# Patient Record
Sex: Male | Born: 1995 | Race: White | Hispanic: No | Marital: Single | Smoking: Never smoker
Health system: Southern US, Community
[De-identification: ages and names within clinical notes are randomized; demographics above are authoritative.]

## PROBLEM LIST (undated history)

## (undated) DIAGNOSIS — K2210 Ulcer of esophagus without bleeding: Secondary | ICD-10-CM

## (undated) HISTORY — DX: Ulcer of esophagus without bleeding: K22.10

## (undated) HISTORY — PX: NO PAST SURGERIES: SHX2092

---

## 2016-04-13 ENCOUNTER — Encounter: Payer: Self-pay | Admitting: Physician Assistant

## 2016-04-14 ENCOUNTER — Encounter: Payer: Self-pay | Admitting: Physician Assistant

## 2017-07-18 ENCOUNTER — Ambulatory Visit: Payer: Commercial Managed Care - PPO | Admitting: Physician Assistant

## 2017-07-18 ENCOUNTER — Other Ambulatory Visit (INDEPENDENT_AMBULATORY_CARE_PROVIDER_SITE_OTHER): Payer: Commercial Managed Care - PPO

## 2017-07-18 ENCOUNTER — Encounter: Payer: Self-pay | Admitting: Physician Assistant

## 2017-07-18 ENCOUNTER — Encounter (INDEPENDENT_AMBULATORY_CARE_PROVIDER_SITE_OTHER): Payer: Self-pay

## 2017-07-18 VITALS — BP 102/60 | HR 98 | Ht 70.0 in | Wt 182.0 lb

## 2017-07-18 DIAGNOSIS — R110 Nausea: Secondary | ICD-10-CM | POA: Diagnosis not present

## 2017-07-18 DIAGNOSIS — R634 Abnormal weight loss: Secondary | ICD-10-CM

## 2017-07-18 DIAGNOSIS — K219 Gastro-esophageal reflux disease without esophagitis: Secondary | ICD-10-CM

## 2017-07-18 DIAGNOSIS — R112 Nausea with vomiting, unspecified: Secondary | ICD-10-CM | POA: Diagnosis not present

## 2017-07-18 LAB — CBC WITH DIFFERENTIAL/PLATELET
Basophils Absolute: 0.1 10*3/uL (ref 0.0–0.1)
Basophils Relative: 1.1 % (ref 0.0–3.0)
EOS PCT: 1 % (ref 0.0–5.0)
Eosinophils Absolute: 0.1 10*3/uL (ref 0.0–0.7)
HCT: 47 % (ref 39.0–52.0)
Hemoglobin: 16.3 g/dL (ref 13.0–17.0)
LYMPHS ABS: 2.2 10*3/uL (ref 0.7–4.0)
Lymphocytes Relative: 25.3 % (ref 12.0–46.0)
MCHC: 34.6 g/dL (ref 30.0–36.0)
MCV: 92.9 fl (ref 78.0–100.0)
Monocytes Absolute: 0.7 10*3/uL (ref 0.1–1.0)
Monocytes Relative: 8 % (ref 3.0–12.0)
Neutro Abs: 5.5 10*3/uL (ref 1.4–7.7)
Neutrophils Relative %: 64.6 % (ref 43.0–77.0)
Platelets: 260 10*3/uL (ref 150.0–400.0)
RBC: 5.05 Mil/uL (ref 4.22–5.81)
RDW: 12.8 % (ref 11.5–14.6)
WBC: 8.6 10*3/uL (ref 4.5–10.5)

## 2017-07-18 LAB — COMPREHENSIVE METABOLIC PANEL
ALK PHOS: 73 U/L (ref 39–117)
ALT: 20 U/L (ref 0–53)
AST: 17 U/L (ref 0–37)
Albumin: 4.8 g/dL (ref 3.5–5.2)
BILIRUBIN TOTAL: 1.3 mg/dL — AB (ref 0.2–1.2)
BUN: 13 mg/dL (ref 6–23)
CO2: 30 mEq/L (ref 19–32)
Calcium: 10.1 mg/dL (ref 8.4–10.5)
Chloride: 101 mEq/L (ref 96–112)
Creatinine, Ser: 1.02 mg/dL (ref 0.40–1.50)
GFR: 98.16 mL/min (ref >60.00)
GLUCOSE: 101 mg/dL — AB (ref 70–99)
POTASSIUM: 4.1 meq/L (ref 3.5–5.1)
SODIUM: 140 meq/L (ref 135–145)
TOTAL PROTEIN: 7.8 g/dL (ref 6.0–8.3)

## 2017-07-18 LAB — SEDIMENTATION RATE: SED RATE: 6 mm/h (ref 0–15)

## 2017-07-18 LAB — HIGH SENSITIVITY CRP: CRP, High Sensitivity: 8.49 mg/L — ABNORMAL HIGH (ref 0.000–5.000)

## 2017-07-18 MED ORDER — ONDANSETRON HCL 4 MG PO TABS: ORAL_TABLET | ORAL | 1 refills | Status: DC

## 2017-07-18 MED ORDER — PANTOPRAZOLE SODIUM 40 MG PO TBEC: DELAYED_RELEASE_TABLET | ORAL | 6 refills | Status: DC

## 2017-07-18 NOTE — Progress Notes (Addendum)
Subjective:    Patient ID: Luis Smith, male    DOB: [birth year], 22 y.o.   MRN: [id number]  HPI Luis Smith is a pleasant 22 year old white male, new to GI today, self-referred.  He is accompanied by his mother.  Patient is a Consulting civil engineer at [employer]. He relates that he has had prior GI evaluation done a little over a year ago in [facility]. [facility].  He apparently had an upper endoscopy which was unremarkable, gastric emptying scan which was normal and an ultrasound which showed sludge in the gallbladder. He remembers being given Prilosec short-term which did help some of his heartburn symptoms, however he was not continued on medication. Says over the past couple of years he has had ongoing problems with heartburn and indigestion particularly with tomato sauce and red sauces.  He says if he eats pizza or something similar he will be miserable. He currently is having very frequent nausea and queasiness.  He wakes up early in the morning feeling nauseated is generally unable to eat any breakfast.  When he does eat he says he feels full and uncomfortable very quickly and bloated after eating.  Continues to have heartburn off and on.  His weight is down about 18 pounds over the past 2 years gradually.  He says he is usually only eating about 1 meal per day because he feels so uncomfortable after eating.  His bowel movements have been normal, no problems with constipation diarrhea melena or hematochezia. Is not on any regular medications. Family history negative for colon cancer or polyps IBD and celiac disease.  Review of Systems Pertinent positive and negative review of systems were noted in the above HPI section.  All other review of systems was otherwise negative.  Outpatient Encounter Medications as of 07/18/2017  Medication Sig  . ondansetron (ZOFRAN) 4 MG tablet Take 1 tab by mouth every 6-8 hours as needed for nausea.  . pantoprazole (PROTONIX)  40 MG tablet Take 1 tab by mouth before dinner daily.   No facility-administered encounter medications on file as of 07/18/2017.    No Known Allergies There are no active problems to display for this patient.  Social History   Socioeconomic History  . Marital status: Single    Spouse name: Not on file  . Number of children: Not on file  . Years of education: Not on file  . Highest education level: Not on file  Occupational History  . Not on file  Social Needs  . Financial resource strain: Not on file  . Food insecurity:    Worry: Not on file    Inability: Not on file  . Transportation needs:    Medical: Not on file    Non-medical: Not on file  Tobacco Use  . Smoking status: Never Smoker  . Smokeless tobacco: Never Used  Substance and Sexual Activity  . Alcohol use: Yes    Comment: RARE  . Drug use: Never  . Sexual activity: Not on file  Lifestyle  . Physical activity:    Days per week: Not on file    Minutes per session: Not on file  . Stress: Not on file  Relationships  . Social connections:    Talks on phone: Not on file    Gets together: Not on file    Attends religious service: Not on file    Active member of club or organization: Not on file    Attends meetings of clubs or organizations:  Not on file    Relationship status: Not on file  . Intimate partner violence:    Fear of current or ex partner: Not on file    Emotionally abused: Not on file    Physically abused: Not on file    Forced sexual activity: Not on file  Other Topics Concern  . Not on file  Social History Narrative  . Not on file    Luis Smith's family history includes Diabetes in his father, paternal grandfather, and paternal grandmother; Heart attack in his maternal grandfather.      Objective:    Vitals:   07/18/17 0918  BP: 102/60  Pulse: 98    Physical Exam well-developed young white male in no acute distress, accompanied by his mother both pleasant blood pressure 102/60 pulse  98, height 5 foot 10, weight 182, BMI 26.1.  HEENT; nontraumatic normocephalic EOMI PERRLA sclera anicteric, Cardiovascular; regular rate and rhythm with S1-S2 no murmur rub or gallop, Pulmonary ;clear bilaterally, Abdomen ;soft, basically nontender there is no palpable mass or hepatosplenomegaly bowel sounds are present, Rectal ;exam not done, Extremities ;no clubbing cyanosis or edema skin warm dry, Neuro psych; mood and affect appropriate       Assessment & Plan:   #261 22 year old white male, college student with 2-year history of heartburn/GERD, and now several month history of progressive frequent nausea and queasiness especially early in the a.m. and postprandially.  In addition has fullness and bloating in the epigastrium postprandially.  He has had an associated 18 pound weight loss over the past 2 years  Previous GI evaluation was unremarkable with the exception of gallbladder sludge.  Rule out symptoms all secondary to GERD, rule out biliary dyskinesia, non-ulcer dyspepsia  Plan; start Protonix 40 mg p.o. before meals dinner Antireflux regimen Zofran 4 mg every 6 hours as needed for nausea.  He is encouraged to try taking 1 in the morning to see if this last him for most of the day. Scheduled for CCK HIDA scan Patient has signed a release and we will obtain his records [facility]. I will plan to see him back in the office in 3-4 weeks.  Patient will be established with Dr. Christella HartiganJacobs.   Addendum; 08/01/17-[facility].  EGD 04/05/2016 question Barrett's esophagus distal third 5 mm GE junction submucosal nodule and 1 cm hiatal hernia.  Biopsies of the GE junction showed regenerative changes consistent with a healing erosion, esophageal biopsies negative for Barrett's gastric emptying scan 4-hour study normal December 2017 upper abdominal ultrasound December 2017 showed gallbladder sludge  no evidence of acute cholecystitis CBD of 3 mm.  Records sent to be scanned.  Amy S Esterwood PA-C 07/18/2017   Cc: No ref. provider found

## 2017-07-18 NOTE — Progress Notes (Signed)
I agree with the above note, plan 

## 2017-07-18 NOTE — Patient Instructions (Addendum)
Please go to the basement level to have your labs drawn.  We have sent the following medications to your pharmacy for you to pick up at your convenience: [facility] [address] [place]. 1. Zofran 4 mg 2. Pantoprazole sodium 40 mg  You have been scheduled for a HIDA scan at [facility] (1st floor) on Tuesday 07-25-17. Please arrive at 7:15 amto your scheduled appointment at  7:30 am Make certain not to have anything to eat or drink after midnight  Should this appointment date or time not work well for you, please call radiology scheduling at [phone].   IIf you are age 21 or younger, your body mass index should be between 19-25. Your Body mass index is 26.11 kg/m. If this is out of the aformentioned range listed, please consider follow up with your Primary Care Provider.   _____________________________________________________________________ hepatobiliary (HIDA) scan is an imaging procedure used to diagnose problems in the liver, gallbladder and bile ducts. In the HIDA scan, a radioactive chemical or tracer is injected into a vein in your arm. The tracer is handled by the liver like bile. Bile is a fluid produced and excreted by your liver that helps your digestive system break down fats in the foods you eat. Bile is stored in your gallbladder and the gallbladder releases the bile when you eat a meal. A special nuclear medicine scanner (gamma camera) tracks the flow of the tracer from your liver into your gallbladder and small intestine.  During your HIDA scan  You'll be asked to change into a hospital gown before your HIDA scan begins. Your health care team will position you on a table, usually on your back. The radioactive tracer is then injected into a vein in your arm.The tracer travels through your bloodstream to your liver, where it's taken up by the bile-producing cells. The radioactive tracer travels with the bile from your liver into your gallbladder and through your bile ducts to  your small intestine.You may feel some pressure while the radioactive tracer is injected into your vein. As you lie on the table, a special gamma camera is positioned over your abdomen taking pictures of the tracer as it moves through your body. The gamma camera takes pictures continually for about an hour. You'll need to keep still during the HIDA scan. This can become uncomfortable, but you may find that you can lessen the discomfort by taking deep breaths and thinking about other things. Tell your health care team if you're uncomfortable. The radiologist will watch on a computer the progress of the radioactive tracer through your body. The HIDA scan may be stopped when the radioactive tracer is seen in the gallbladder and enters your small intestine. This typically takes about an hour. In some cases extra imaging will be performed if original images aren't satisfactory, if morphine is given to help visualize the gallbladder or if the medication CCK is given to look at the contraction of the gallbladder. This test typically takes 2 hours to complete. ________________________________________________________________________

## 2017-07-25 ENCOUNTER — Ambulatory Visit (HOSPITAL_COMMUNITY)
Admission: RE | Admit: 2017-07-25 | Discharge: 2017-07-25 | Disposition: A | Discharge status: Home / Self Care | Payer: Commercial Managed Care - PPO | Source: Ambulatory Visit | Attending: Physician Assistant | Admitting: Physician Assistant

## 2017-07-25 DIAGNOSIS — R110 Nausea: Secondary | ICD-10-CM

## 2017-07-25 DIAGNOSIS — R634 Abnormal weight loss: Secondary | ICD-10-CM | POA: Insufficient documentation

## 2017-07-25 DIAGNOSIS — R112 Nausea with vomiting, unspecified: Secondary | ICD-10-CM | POA: Insufficient documentation

## 2017-07-25 DIAGNOSIS — K219 Gastro-esophageal reflux disease without esophagitis: Secondary | ICD-10-CM | POA: Diagnosis present

## 2017-07-25 MED ORDER — TECHNETIUM TC 99M MEBROFENIN IV KIT
5.0300 | PACK | Freq: Once | INTRAVENOUS | Status: AC | PRN
  Administered 2017-07-25: 5.03 via INTRAVENOUS

## 2017-07-28 ENCOUNTER — Telehealth: Payer: Self-pay | Admitting: Physician Assistant

## 2017-07-28 NOTE — Telephone Encounter (Signed)
Advised. Please see the imaging report.

## 2017-07-28 NOTE — Telephone Encounter (Signed)
Patient wanting to know when results of hida scan from 4.9.19 will be back. Requesting a call to discuss.

## 2017-09-26 ENCOUNTER — Other Ambulatory Visit (INDEPENDENT_AMBULATORY_CARE_PROVIDER_SITE_OTHER): Payer: Commercial Managed Care - PPO

## 2017-09-26 ENCOUNTER — Encounter: Payer: Self-pay | Admitting: Physician Assistant

## 2017-09-26 ENCOUNTER — Ambulatory Visit (INDEPENDENT_AMBULATORY_CARE_PROVIDER_SITE_OTHER): Payer: Commercial Managed Care - PPO | Admitting: Physician Assistant

## 2017-09-26 ENCOUNTER — Encounter (INDEPENDENT_AMBULATORY_CARE_PROVIDER_SITE_OTHER): Payer: Self-pay

## 2017-09-26 VITALS — BP 102/58 | HR 80 | Ht 70.0 in | Wt 186.0 lb

## 2017-09-26 DIAGNOSIS — R1013 Epigastric pain: Secondary | ICD-10-CM | POA: Diagnosis not present

## 2017-09-26 DIAGNOSIS — R6881 Early satiety: Secondary | ICD-10-CM

## 2017-09-26 DIAGNOSIS — R110 Nausea: Secondary | ICD-10-CM

## 2017-09-26 DIAGNOSIS — K219 Gastro-esophageal reflux disease without esophagitis: Secondary | ICD-10-CM

## 2017-09-26 LAB — IGA: IgA: 148 mg/dL (ref 68–378)

## 2017-09-26 MED ORDER — PANTOPRAZOLE SODIUM 40 MG PO TBEC: DELAYED_RELEASE_TABLET | ORAL | 6 refills | Status: DC

## 2017-09-26 NOTE — Patient Instructions (Signed)
Your provider has requested that you go to the basement level for lab work before leaving today. Press "B" on the elevator. The lab is located at the first door on the left as you exit the elevator. We sent a prescription to your pharmacy for Pantoprazole sodium 40 mg.  .  You have been scheduled for an endoscopy. Please follow written instructions given to you at your visit today. If you use inhalers (even only as needed), please bring them with you on the day of your procedure.

## 2017-09-26 NOTE — Progress Notes (Signed)
Subjective:    Patient ID: Luis Smith, male    DOB: 03/02/1996, 22 y.o.   MRN: [id number]  HPI Luis Smith is a pleasant 22 year old white male, who was seen in our office on 07/18/2017 by myself and established with Dr. Christella HartiganJacobs.  He comes back in today for follow-up.   Patient was seen with complaints of heartburn and indigestion and a several month history of frequent nausea and queasiness especially prevalent early in the mornings and then postprandially.  In association with that he had sense of abdominal fullness and bloating and some early satiety.  He related an 18 pound weight loss over the past 2 years. [place] had [place] with EGD which was negative with exception of Barrett's esophagus.  Biopsies were taken and were negative.  4-hour gastric emptying scan was done and normal and abdominal ultrasound showed gallbladder sludge but no stones in the CBD of 3 mm. Patient was placed on Protonix 40 mg p.o. every morning at the time of his last office visit and was given Zofran to use on a as needed basis.  We scheduled him for CCK HIDA scan which showed a normal ejection fraction. Labs were also done CRP was mildly elevated at 8.4, sed rate was 6 labs otherwise unremarkable. His weight is stable actually up about 4 pounds since his last visit. He says he feels about the same but on further questioning has not been having any difficulty with bloating or gas recently.  He does not feel that his symptoms are related to anxiety, other than some anxiety about feeling nauseated.  He says he can tell when his nausea is going to start and usually he stops eating for a few hours and then it subsides.  He will still have symptoms of feeling as if his food is sitting in his esophagus, which gives him a very full uncomfortable feeling.  He has no complaints of dysphasia or odynophagia and is not having any heartburn or indigestion at this point. 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   ROS; Pertinent positive and negative review of systems were noted in the above HPI section.  All other review of systems was otherwise negative.  Outpatient Encounter Medications as of 09/26/2017  Medication Sig  . ondansetron (ZOFRAN) 4 MG tablet Take 1 tab by mouth every 6-8 hours as needed for nausea.  . pantoprazole (PROTONIX) 40 MG tablet Take 1 tab by mouth  Every morning.  . [DISCONTINUED] pantoprazole (PROTONIX) 40 MG tablet Take 1 tab by mouth before dinner daily.   No facility-administered encounter medications on file as of 09/26/2017.    No Known Allergies There are no active problems to display for this patient.  Social History   Socioeconomic History  . Marital status: Single    Spouse name: Not on file  . Number of children: Not on file  . Years of education: Not on file  . Highest education level: Not on file  Occupational History  . Not on file  Social Needs  . Financial resource strain: Not on file  . Food insecurity:    Worry: Not on file    Inability: Not on file  . Transportation needs:    Medical: Not on file    Non-medical: Not on file  Tobacco Use  . Smoking status: Never Smoker  . Smokeless tobacco: Never Used  Substance and Sexual Activity  .  Alcohol use: Yes    Comment: RARE  . Drug use: Never  . Sexual activity: Not on file  Lifestyle  . Physical activity:    Days per week: Not on file    Minutes per session: Not on file  . Stress: Not on file  Relationships  . Social connections:    Talks on phone: Not on file    Gets together: Not on file    Attends religious service: Not on file    Active member of club or organization: Not on file    Attends meetings of clubs or organizations: Not on file    Relationship status: Not on file  . Intimate partner violence:    Fear of current or ex partner: Not on file    Emotionally abused: Not on file    Physically  abused: Not on file    Forced sexual activity: Not on file  Other Topics Concern  . Not on file  Social History Narrative  . Not on file    Mr. Markgraf family history includes Diabetes in his father, paternal grandfather, and paternal grandmother; Heart attack in his maternal grandfather.      Objective:    Vitals:   09/26/17 0824  BP: (!) 102/58  Pulse: 80    Physical Exam; well-developed young white male, no acute distress, pleasant accompanied by his mother blood pressure 102/58, pulse 80, height 5 foot 10, weight 186, BMI 26.6.  Not further examined today       Assessment & Plan:   #6 22 year old white male with several month history of frequent nausea queasiness generally most noticeable early in the morning and then postprandially very This had been associated with heartburn and indigestion which has improved on PPI therapy.  He is also had associated fullness, some early satiety and vague dysphasia. Previous GI work-up in 2017 done in [facility] with negative EGD other than question of Barrett's however biopsies were negative, abdominal ultrasound showed gallbladder sludge and gastric emptying scan was normal. Recent CCK HIDA scan normal.  Etiology of his symptoms is not entirely clear, I think he does have a component of GERD which is improved with PPI therapy.  Rule out chronic gastropathy, functional dyspepsia, celiac disease.  Plan; continue Protonix 40 mg p.o. every morning Patient will be scheduled for upper endoscopy with biopsies, with Dr. Christella Hartigan.  Procedure was discussed in detail with the patient including indications risks and benefits and he is agreeable to proceed. Check TTG and IgA If EGD is unremarkable, consider CT imaging, and also would consider trial of BuSpar for possible functional dyspepsia.  Lilymarie Scroggins S Larenzo Caples PA-C 09/26/2017   Cc: No ref. provider found

## 2017-09-27 NOTE — Progress Notes (Signed)
I agree with the above note, plan 

## 2017-09-28 ENCOUNTER — Encounter: Payer: Self-pay | Admitting: Gastroenterology

## 2017-09-28 LAB — TISSUE TRANSGLUTAMINASE, IGG: (TTG) AB, IGG: 1 U/mL

## 2017-10-10 ENCOUNTER — Encounter: Payer: Self-pay | Admitting: Gastroenterology

## 2017-10-10 ENCOUNTER — Ambulatory Visit (AMBULATORY_SURGERY_CENTER): Payer: Commercial Managed Care - PPO | Admitting: Gastroenterology

## 2017-10-10 ENCOUNTER — Other Ambulatory Visit: Payer: Self-pay

## 2017-10-10 VITALS — BP 116/60 | HR 70 | Temp 98.6°F | Resp 10 | Ht 70.0 in | Wt 186.0 lb

## 2017-10-10 DIAGNOSIS — K209 Esophagitis, unspecified without bleeding: Secondary | ICD-10-CM

## 2017-10-10 DIAGNOSIS — K2990 Gastroduodenitis, unspecified, without bleeding: Secondary | ICD-10-CM

## 2017-10-10 DIAGNOSIS — K2970 Gastritis, unspecified, without bleeding: Secondary | ICD-10-CM

## 2017-10-10 DIAGNOSIS — R112 Nausea with vomiting, unspecified: Secondary | ICD-10-CM

## 2017-10-10 DIAGNOSIS — R110 Nausea: Secondary | ICD-10-CM

## 2017-10-10 DIAGNOSIS — K2950 Unspecified chronic gastritis without bleeding: Secondary | ICD-10-CM | POA: Diagnosis not present

## 2017-10-10 MED ORDER — SODIUM CHLORIDE 0.9 % IV SOLN: 500.0000 mL | Freq: Once | INTRAVENOUS | Status: DC

## 2017-10-10 MED ORDER — PANTOPRAZOLE SODIUM 40 MG PO TBEC: DELAYED_RELEASE_TABLET | ORAL | 11 refills | Status: DC

## 2017-10-10 NOTE — Patient Instructions (Signed)
**   Handouts given on gastritis and esophagitis ** Start taking Pantoprazole twice daily as directed. Also take Ranitidine 150mg  (OTC) at bedtime. **   YOU HAD AN ENDOSCOPIC PROCEDURE TODAY AT THE [facility]:   Refer to the procedure report that was given to you for any specific questions about what was found during the examination.  If the procedure report does not answer your questions, please call your gastroenterologist to clarify.  If you requested that your care partner not be given the details of your procedure findings, then the procedure report has been included in a sealed envelope for you to review at your convenience later.  YOU SHOULD EXPECT: Some feelings of bloating in the abdomen. Passage of more gas than usual.  Walking can help get rid of the air that was put into your GI tract during the procedure and reduce the bloating. If you had a lower endoscopy (such as a colonoscopy or flexible sigmoidoscopy) you may notice spotting of blood in your stool or on the toilet paper. If you underwent a bowel prep for your procedure, you may not have a normal bowel movement for a few days.  Please Note:  You might notice some irritation and congestion in your nose or some drainage.  This is from the oxygen used during your procedure.  There is no need for concern and it should clear up in a day or so.  SYMPTOMS TO REPORT IMMEDIATELY:   Following upper endoscopy (EGD)  Vomiting of blood or coffee ground material  New chest pain or pain under the shoulder blades  Painful or persistently difficult swallowing  New shortness of breath  Fever of 100F or higher  Black, tarry-looking stools  For urgent or emergent issues, a gastroenterologist can be reached at any hour by calling [phone].   DIET:  We do recommend a small meal at first, but then you may proceed to your regular diet.  Drink plenty of fluids but you should avoid alcoholic beverages for 24 hours.  ACTIVITY:  You  should plan to take it easy for the rest of today and you should NOT DRIVE or use heavy machinery until tomorrow (because of the sedation medicines used during the test).    FOLLOW UP: Our staff will call the number listed on your records the next business day following your procedure to check on you and address any questions or concerns that you may have regarding the information given to you following your procedure. If we do not reach you, we will leave a message.  However, if you are feeling well and you are not experiencing any problems, there is no need to return our call.  We will assume that you have returned to your regular daily activities without incident.  If any biopsies were taken you will be contacted by phone or by letter within the next 1-3 weeks.  Please call us at [phone] if you have not heard about the biopsies in 3 weeks.    SIGNATURES/CONFIDENTIALITY: You and/or your care partner have signed paperwork which will be entered into your electronic medical record.  These signatures attest to the fact that that the information above on your After Visit Summary has been reviewed and is understood.  Full responsibility of the confidentiality of this discharge information lies with you and/or your care-partner.

## 2017-10-10 NOTE — Op Note (Signed)
[facility] Patient Name: Luis MaesWalter Smith Procedure Date: 10/10/2017 8:00 AM MRN: [id number] Endoscopist: Rachael Feeaniel P Meliya Mcconahy , MD Age: [age] Referring MD:  Date of Birth: 08/30/1995 Gender: Male Account #: [id number] Procedure:                Upper GI endoscopy Indications:              Early satiety, Nausea with vomiting Medicines:                Monitored Anesthesia Care Procedure:                Pre-Anesthesia Assessment:                           - Prior to the procedure, a History and Physical                            was performed, and patient medications and                            allergies were reviewed. The patient's tolerance of                            previous anesthesia was also reviewed. The risks                            and benefits of the procedure and the sedation                            options and risks were discussed with the patient.                            All questions were answered, and informed consent                            was obtained. Prior Anticoagulants: The patient has                            taken no previous anticoagulant or antiplatelet                            agents. ASA Grade Assessment: II - A patient with                            mild systemic disease. After reviewing the risks                            and benefits, the patient was deemed in                            satisfactory condition to undergo the procedure.                           After obtaining informed consent, the endoscope was  passed under direct vision. Throughout the                            procedure, the patient's blood pressure, pulse, and                            oxygen saturations were monitored continuously. The                            Endoscope was introduced through the mouth, and                            advanced to the second part of duodenum. The upper                            GI endoscopy was  accomplished without difficulty.                            The patient tolerated the procedure well. 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                           Mild, non-specific inflammation characterized by                            erythema was found in the gastric antrum. Biopsies                            were taken with a cold forceps for histology (jar                            1).                           Focal area of more erythematous stomach mucosa                            (about 4cm across, located in cardia/proximal body                            along the greater curvature). This is not overtly                            neoplastic, biopsied taken (jar 2).                           The exam was otherwise without abnormality. Complications:            No immediate complications. Estimated blood loss:  None. Estimated Blood Loss:     Estimated blood loss: none. Impression:               - Gastritis biopsied in two jars.                           - Severe, acid related ulcerative esophagitis.                           - The examination was otherwise normal. Recommendation:           - Patient has a contact number available for                            emergencies. The signs and symptoms of potential                            delayed complications were discussed with the                            patient. Return to normal activities tomorrow.                            Written discharge instructions were provided to the                            patient.                           - Resume previous diet.                           - Need to increase your antiacid medicines: New                            prescription today: protonix 40mg  pill one pill                            twice daily (20-30 min before breakfast and dinner                             meals). Also please start ranitidine (OTC) 150mg                             pill, one pill at bedtime nightly.                           - Await pathology results. Rachael Fee, MD 10/10/2017 8:15:16 AM This report has been signed electronically.

## 2017-10-10 NOTE — Progress Notes (Signed)
Pt's states no medical or surgical changes since previsit or office visit. 

## 2017-10-10 NOTE — Progress Notes (Signed)
Called to room to assist during endoscopic procedure.  Patient ID and intended procedure confirmed with present staff. Received instructions for my participation in the procedure from the performing physician.  

## 2017-10-10 NOTE — Progress Notes (Signed)
A and O x3. Report to RN. Tolerated MAC anesthesia well.Teeth unchanged after procedure.

## 2017-10-11 ENCOUNTER — Telehealth: Payer: Self-pay

## 2017-10-11 NOTE — Telephone Encounter (Signed)
  Follow up Call-  Call back number 10/10/2017  Post procedure Call Back phone  # [phone]  Permission to leave phone message Yes     Patient questions:  Do you have a fever, pain , or abdominal swelling? No. Pain Score  0 *  Have you tolerated food without any problems? Yes.    Have you been able to return to your normal activities? Yes.    Do you have any questions about your discharge instructions: Diet   No. Medications  No. Follow up visit  No.  Do you have questions or concerns about your Care? No.  Actions: * If pain score is 4 or above: No action needed, pain <4.

## 2017-10-23 ENCOUNTER — Telehealth: Payer: Self-pay | Admitting: *Deleted

## 2017-10-23 NOTE — Telephone Encounter (Signed)
[facility] [place] Stamped and sent to be scnanned on 10-23-2017.

## 2017-12-07 ENCOUNTER — Ambulatory Visit: Payer: Commercial Managed Care - PPO | Admitting: Physician Assistant

## 2018-01-08 ENCOUNTER — Encounter (INDEPENDENT_AMBULATORY_CARE_PROVIDER_SITE_OTHER): Payer: Self-pay

## 2018-01-08 ENCOUNTER — Ambulatory Visit (INDEPENDENT_AMBULATORY_CARE_PROVIDER_SITE_OTHER): Payer: Commercial Managed Care - PPO | Admitting: Physician Assistant

## 2018-01-08 ENCOUNTER — Encounter: Payer: Self-pay | Admitting: Physician Assistant

## 2018-01-08 VITALS — BP 100/64 | HR 68 | Ht 70.0 in | Wt 192.2 lb

## 2018-01-08 DIAGNOSIS — K2100 Gastro-esophageal reflux disease with esophagitis, without bleeding: Secondary | ICD-10-CM

## 2018-01-08 DIAGNOSIS — K209 Esophagitis, unspecified without bleeding: Secondary | ICD-10-CM

## 2018-01-08 MED ORDER — PANTOPRAZOLE SODIUM 40 MG PO TBEC: DELAYED_RELEASE_TABLET | ORAL | 11 refills | Status: AC

## 2018-01-08 NOTE — Patient Instructions (Addendum)
We sent refills for Pantoprazole sodium 40 mg to [facility] [address]  [place]. Continue over the counter Zantac 150 mg at bedtime. Continue Antireflux regimine.  Elevated the head of your bed 45 degrees.  Have nothing to eat or drink for 2-3 hours before bed.   Normal BMI (Body Mass Index- based on height and weight) is between 19 and 25. Your BMI today is Body mass index is 27.58 kg/m. Marland Kitchen. Please consider follow up  regarding your BMI with your Primary Care Provider.

## 2018-01-08 NOTE — Progress Notes (Signed)
I agree with the above note, plan 

## 2018-01-08 NOTE — Progress Notes (Signed)
Subjective:    Patient ID: Luis Smith, male    DOB: 11/25/1995, 22 y.o.   MRN: [id number]  HPI Luis Smith is a pleasant 22 year old white male, known to Dr. Christella HartiganJacobs and myself.  Patient was initially seen in the office in June 2019 with complaints of dyspepsia heartburn indigestion and queasiness which seems to be worse early in the mornings and then postprandially.  He had been having symptoms for a couple of years worse over the previous year.  He related an 18 pound weight loss.  He underwent EGD with Dr. 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  Biopsies were taken from the stomach showing mild chronic gastritis no H. pylori.  At that time PPI was increased to twice daily 40 mg Protonix and in addition added to 150 mg of Zantac at bedtime.   He comes back in today for follow-up.  He says the medications helped a lot and his symptoms are much improved though not completely resolved.  He took the Protonix for about 2 months and then ran out and has not had a refill on the prescription over the past few weeks.  He is continued on Zantac 150 mg at bedtime.  He has been following an antireflux regimen, trying to go to bed with an empty stomach and sleeping with 2-3 pillows to elevate head of bed.  He denies any problems with nausea or queasiness and no abdominal discomfort today.  No dysphagia or odynophagia.  Review of Systems Pertinent positive and negative review of systems were noted in the above HPI section.  All other review of systems was otherwise negative.  Outpatient Encounter Medications as of 01/08/2018  Medication Sig  . Pseudoephedrine HCl (SUDAFED PO) Take by mouth as directed.  . pantoprazole (PROTONIX) 40 MG tablet Take one tablet by mouth twice daily. (20-30 minutes before breakfast and dinner meals)  . [DISCONTINUED] ondansetron (ZOFRAN) 4 MG tablet Take 1 tab by mouth every 6-8 hours as needed  for nausea.  . [DISCONTINUED] pantoprazole (PROTONIX) 40 MG tablet Take one tablet by mouth twice daily. (20-30 minutes before breakfast and dinner meals) (Patient not taking: Reported on 01/08/2018)  . [DISCONTINUED] 0.9 %  sodium chloride infusion    No facility-administered encounter medications on file as of 01/08/2018.    No Known Allergies There are no active problems to display for this patient.  Social History   Socioeconomic History  . Marital status: Single    Spouse name: Not on file  . Number of children: Not on file  . Years of education: Not on file  . Highest education level: Not on file  Occupational History  . Not on file  Social Needs  . Financial resource strain: Not on file  . Food insecurity:    Worry: Not on file    Inability: Not on file  . Transportation needs:    Medical: Not on file    Non-medical: Not on file  Tobacco Use  . Smoking status: Never Smoker  . Smokeless tobacco: Never Used  Substance and Sexual Activity  . Alcohol use: Yes    Comment: RARE  . Drug use: Never  . Sexual activity: Not on file  Lifestyle  . Physical activity:    Days per week: Not on file    Minutes per session: Not on file  . Stress: Not on file  Relationships  . Social connections:  Talks on phone: Not on file    Gets together: Not on file    Attends religious service: Not on file    Active member of club or organization: Not on file    Attends meetings of clubs or organizations: Not on file    Relationship status: Not on file  . Intimate partner violence:    Fear of current or ex partner: Not on file    Emotionally abused: Not on file    Physically abused: Not on file    Forced sexual activity: Not on file  Other Topics Concern  . Not on file  Social History Narrative  . Not on file    Mr. Meador family history includes Diabetes in his father, paternal grandfather, and paternal grandmother; Heart attack in his maternal grandfather.        Objective:    Vitals:   01/08/18 0949  BP: 100/64  Pulse: 68    Physical Exam; well-developed young white male in no acute distress, pleasant blood pressure 100/64 pulse 68, weight 192, stable.  HEENT; nontraumatic normocephalic EOMI PERRLA sclera anicteric oral mucosa moist, Cardiovascular; regular rate and rhythm with S1-S2 no murmur rub or gallop, Pulmonary; clear bilaterally, Abdomen; soft nontender nondistended bowel sounds are active no palpable mass or hepatosplenomegaly.  Rectal; exam not done, Extrem; no clubbing cyanosis or edema skin warm dry, neurosych ;alert and oriented x3, mood and affect appropriate grossly nonfocal       Assessment & Plan:   #[age] year old white male with severe reflux esophagitis disease LA grade D) on recent EGD 10/10/2017. And had been symptomatic with heartburn indigestion dyspepsia queasiness and fullness.   He is much improved on twice daily Protonix and at bedtime Zantac.  Plan; We  reviewed an antireflux regimen again, also advised he elevate the head of his bed 6 inches or try sleeping with a wedge pillow or antireflux pillow rather than just extra pillows. We will refill Protonix 40 mg p.o. twice daily, asked him to continue twice daily for another couple of months and then if doing well try to decrease Protonix to 40 mg p.o. every morning.  He was given a years worth of refills. Continue Zantac on her and 50 mg p.o. nightly. We will plan follow-up in 1 year, happy to see him sooner in the interim for any problems.  Buffie Herne S Collene Massimino PA-C 01/08/2018   Cc: No ref. provider found
